# Patient Record
Sex: Female | Born: 1990 | Race: White | Hispanic: No | Marital: Single | Smoking: Never smoker
Health system: Southern US, Community
[De-identification: ages and names within clinical notes are randomized; demographics above are authoritative.]

## PROBLEM LIST (undated history)

## (undated) DIAGNOSIS — G43909 Migraine, unspecified, not intractable, without status migrainosus: Secondary | ICD-10-CM

## (undated) DIAGNOSIS — N83209 Unspecified ovarian cyst, unspecified side: Secondary | ICD-10-CM

## (undated) DIAGNOSIS — F419 Anxiety disorder, unspecified: Secondary | ICD-10-CM

## (undated) HISTORY — DX: Anxiety disorder, unspecified: F41.9

## (undated) HISTORY — DX: Migraine, unspecified, not intractable, without status migrainosus: G43.909

## (undated) HISTORY — DX: Unspecified ovarian cyst, unspecified side: N83.209

---

## 2012-08-04 ENCOUNTER — Ambulatory Visit: Payer: Self-pay | Admitting: Family Medicine

## 2016-12-09 ENCOUNTER — Emergency Department
Admission: EM | Admit: 2016-12-09 | Discharge: 2016-12-10 | Disposition: A | Discharge status: Home / Self Care | Payer: Self-pay | Attending: Emergency Medicine | Admitting: Emergency Medicine

## 2016-12-09 ENCOUNTER — Encounter: Payer: Self-pay | Admitting: Emergency Medicine

## 2016-12-09 DIAGNOSIS — A0472 Enterocolitis due to Clostridium difficile, not specified as recurrent: Secondary | ICD-10-CM | POA: Insufficient documentation

## 2016-12-09 LAB — URINALYSIS, COMPLETE (UACMP) WITH MICROSCOPIC
BACTERIA UA: NONE SEEN
Bilirubin Urine: NEGATIVE
Glucose, UA: NEGATIVE mg/dL
Ketones, ur: NEGATIVE mg/dL
Leukocytes, UA: NEGATIVE
Nitrite: NEGATIVE
PH: 7 (ref 5.0–8.0)
Protein, ur: NEGATIVE mg/dL
SPECIFIC GRAVITY, URINE: 1.014 (ref 1.005–1.030)

## 2016-12-09 LAB — COMPREHENSIVE METABOLIC PANEL
ALT: 12 U/L — ABNORMAL LOW (ref 14–54)
AST: 22 U/L (ref 15–41)
Albumin: 4.6 g/dL (ref 3.5–5.0)
Alkaline Phosphatase: 75 U/L (ref 38–126)
Anion gap: 7 (ref 5–15)
BILIRUBIN TOTAL: 0.3 mg/dL (ref 0.3–1.2)
BUN: 19 mg/dL (ref 6–20)
CHLORIDE: 108 mmol/L (ref 101–111)
CO2: 26 mmol/L (ref 22–32)
CREATININE: 1.43 mg/dL — AB (ref 0.44–1.00)
Calcium: 9.7 mg/dL (ref 8.9–10.3)
GFR, EST AFRICAN AMERICAN: 58 mL/min — AB (ref >60)
GFR, EST NON AFRICAN AMERICAN: 50 mL/min — AB (ref >60)
Glucose, Bld: 104 mg/dL — ABNORMAL HIGH (ref 65–99)
POTASSIUM: 4.1 mmol/L (ref 3.5–5.1)
Sodium: 141 mmol/L (ref 135–145)
TOTAL PROTEIN: 8 g/dL (ref 6.5–8.1)

## 2016-12-09 LAB — CBC
HEMATOCRIT: 40.9 % (ref 35.0–47.0)
Hemoglobin: 14.1 g/dL (ref 12.0–16.0)
MCH: 31.9 pg (ref 26.0–34.0)
MCHC: 34.5 g/dL (ref 32.0–36.0)
MCV: 92.4 fL (ref 80.0–100.0)
PLATELETS: 254 10*3/uL (ref 150–440)
RBC: 4.42 MIL/uL (ref 3.80–5.20)
RDW: 12.9 % (ref 11.5–14.5)
WBC: 7.4 10*3/uL (ref 3.6–11.0)

## 2016-12-09 LAB — LIPASE, BLOOD: Lipase: 39 U/L (ref 11–51)

## 2016-12-09 LAB — POCT PREGNANCY, URINE: Preg Test, Ur: NEGATIVE

## 2016-12-09 NOTE — ED Triage Notes (Addendum)
Patient ambulatory to triage with steady gait, without difficulty or distress noted; pt reports mid abd pain x 1-2 weeks accomp by nausea; recent dx with cdiff with no relief after taking meds; denies any current diarrhea, states stools are now soft

## 2016-12-10 ENCOUNTER — Encounter: Payer: Self-pay | Admitting: Radiology

## 2016-12-10 ENCOUNTER — Emergency Department: Payer: Self-pay

## 2016-12-10 MED ORDER — OXYCODONE-ACETAMINOPHEN 5-325 MG PO TABS
1.0000 | ORAL_TABLET | Freq: Once | ORAL | Status: AC
  Administered 2016-12-10: 1 via ORAL

## 2016-12-10 MED ORDER — OXYCODONE-ACETAMINOPHEN 5-325 MG PO TABS
ORAL_TABLET | ORAL | Status: AC
  Filled 2016-12-10: qty 1

## 2016-12-10 MED ORDER — ONDANSETRON HCL 4 MG/2ML IJ SOLN
INTRAMUSCULAR | Status: AC
  Administered 2016-12-10: 4 mg via INTRAVENOUS
  Filled 2016-12-10: qty 2

## 2016-12-10 MED ORDER — SODIUM CHLORIDE 0.9 % IV BOLUS (SEPSIS)
1000.0000 mL | Freq: Once | INTRAVENOUS | Status: AC
  Administered 2016-12-10: 1000 mL via INTRAVENOUS

## 2016-12-10 MED ORDER — MORPHINE SULFATE (PF) 4 MG/ML IV SOLN
4.0000 mg | Freq: Once | INTRAVENOUS | Status: AC
  Administered 2016-12-10: 4 mg via INTRAVENOUS

## 2016-12-10 MED ORDER — VANCOMYCIN HCL 125 MG PO CAPS: 125.0000 mg | ORAL_CAPSULE | Freq: Four times a day (QID) | ORAL | 0 refills | Status: AC

## 2016-12-10 MED ORDER — IOPAMIDOL (ISOVUE-300) INJECTION 61%
75.0000 mL | Freq: Once | INTRAVENOUS | Status: AC | PRN
  Administered 2016-12-10: 75 mL via INTRAVENOUS

## 2016-12-10 MED ORDER — OXYCODONE-ACETAMINOPHEN 5-325 MG PO TABS: 1.0000 | ORAL_TABLET | ORAL | 0 refills | Status: DC | PRN

## 2016-12-10 MED ORDER — ONDANSETRON HCL 4 MG/2ML IJ SOLN
4.0000 mg | Freq: Once | INTRAMUSCULAR | Status: AC
  Administered 2016-12-10: 4 mg via INTRAVENOUS

## 2016-12-10 MED ORDER — DICYCLOMINE HCL 20 MG PO TABS: 20.0000 mg | ORAL_TABLET | Freq: Three times a day (TID) | ORAL | 0 refills | Status: DC | PRN

## 2016-12-10 MED ORDER — MORPHINE SULFATE (PF) 4 MG/ML IV SOLN
INTRAVENOUS | Status: AC
  Administered 2016-12-10: 4 mg via INTRAVENOUS
  Filled 2016-12-10: qty 1

## 2016-12-10 NOTE — ED Notes (Signed)
Pt discharged to home.  Friend driving.  Discharge instructions reviewed.  Verbalized understanding.  No questions or concerns at this time.  Teach back verified.  Pt in NAD.  No items left in ED.   

## 2016-12-10 NOTE — ED Provider Notes (Signed)
[facility] Emergency Department Provider Note _   First MD Initiated Contact with Patient 12/09/16 2345     (approximate)  I have reviewed the triage vital signs and the nursing notes.   HISTORY  Chief Complaint Abdominal Pain    HPI Alyssa Macias is a 26 y.o. female with history of recently diagnosed Clostridium difficile colitis on 11/28/2016 presents to the emergency department with 8 out of 10 generalized abdominal pain. Patient denies any diarrhea stating that her stool is now formed in that she's been taking metronidazole for 6 days however she believes that "it is not working". Patient denies any fever. Patient denies any urinary symptoms.   Past medical history Clostridium difficile colitis diagnosed 11/28/2016 There are no active problems to display for this patient.   Past Surgical History:  Procedure Laterality Date  . CESAREAN SECTION      Prior to Admission medications   Medication Sig Start Date End Date Taking? Authorizing Provider  dicyclomine (BENTYL) 20 MG tablet Take 1 tablet (20 mg total) by mouth 3 (three) times daily as needed for spasms. 12/10/16 12/10/17  Darci Current, MD  oxyCODONE-acetaminophen (ROXICET) 5-325 MG tablet Take 1 tablet by mouth every 4 (four) hours as needed for severe pain. 12/10/16   Darci Current, MD  vancomycin (VANCOCIN) 125 MG capsule Take 1 capsule (125 mg total) by mouth 4 (four) times daily. 12/10/16 12/20/16  Darci Current, MD    Allergies Penicillins  No family history on file.  Social History Social History  Substance Use Topics  . Smoking status: Never Smoker  . Smokeless tobacco: Never Used  . Alcohol use No    Review of Systems Constitutional: No fever/chills Eyes: No visual changes. ENT: No sore throat. Cardiovascular: Denies chest pain. Respiratory: Denies shortness of breath. Gastrointestinal:Positive for abdominal pain no nausea vomiting  Genitourinary: Negative for  dysuria. Musculoskeletal: Negative for neck pain.  Negative for back pain. Integumentary: Negative for rash. Neurological: Negative for headaches, focal weakness or numbness.  ____________________________________________   PHYSICAL EXAM:  VITAL SIGNS: ED Triage Vitals [12/09/16 2145]  Enc Vitals Group     BP (!) 142/89     Pulse Rate (!) 115     Resp 18     Temp 98 F (36.7 C)     Temp Source Oral     SpO2 98 %     Weight 51.7 kg (114 lb)     Height 1.499 m (4\' 11" )     Head Circumference      Peak Flow      Pain Score 10     Pain Loc      Pain Edu?      Excl. in GC?     Constitutional: Alert and oriented. Well appearing and in no acute distress. Eyes: Conjunctivae are normal.  Head: Atraumatic. Mouth/Throat: Mucous membranes are moist. Neck: No stridor.   Cardiovascular: Normal rate, regular rhythm. Good peripheral circulation. Grossly normal heart sounds. Respiratory: Normal respiratory effort.  No retractions. Lungs CTAB. Gastrointestinal: Generalized tenderness to palpation. No distention.  Musculoskeletal: No lower extremity tenderness nor edema. No gross deformities of extremities. Neurologic:  Normal speech and language. No gross focal neurologic deficits are appreciated.  Skin:  Skin is warm, dry and intact. No rash noted. Psychiatric: Mood and affect are normal. Speech and behavior are normal.  ____________________________________________   LABS (all labs ordered are listed, but only abnormal results are displayed)  Labs Reviewed  COMPREHENSIVE  METABOLIC PANEL - Abnormal; Notable for the following:       Result Value   Glucose, Bld 104 (*)    Creatinine, Ser 1.43 (*)    ALT 12 (*)    GFR calc non Af Amer 50 (*)    GFR calc Af Amer 58 (*)    All other components within normal limits  URINALYSIS, COMPLETE (UACMP) WITH MICROSCOPIC - Abnormal; Notable for the following:    Color, Urine STRAW (*)    APPearance CLEAR (*)    Hgb urine dipstick SMALL (*)      Squamous Epithelial / LPF 0-5 (*)    All other components within normal limits  LIPASE, BLOOD  CBC  POC URINE PREG, ED  POCT PREGNANCY, URINE     RADIOLOGY I, [place] N BROWN, personally viewed and evaluated these images (plain radiographs) as part of my medical decision making, as well as reviewing the written report by the radiologist.  Ct Abdomen Pelvis W Contrast  Result Date: 12/10/2016 CLINICAL DATA:  Mid abdominal pain for 1-2 weeks.  Nausea. EXAM: CT ABDOMEN AND PELVIS WITH CONTRAST TECHNIQUE: Multidetector CT imaging of the abdomen and pelvis was performed using the standard protocol following bolus administration of intravenous contrast. CONTRAST:  75mL ISOVUE-300 IOPAMIDOL (ISOVUE-300) INJECTION 61% COMPARISON:  None. FINDINGS: Lower chest: No acute abnormality. Hepatobiliary: No focal liver abnormality is seen. No gallstones, gallbladder wall thickening, or biliary dilatation. Pancreas: Unremarkable. No pancreatic ductal dilatation or surrounding inflammatory changes. Spleen: Normal in size without focal abnormality. Adrenals/Urinary Tract: Adrenal glands are unremarkable. Kidneys are normal, without renal calculi, focal lesion, or hydronephrosis. Bladder is unremarkable. Stomach/Bowel: Stomach is within normal limits. Appendix is normal. No evidence of bowel wall thickening, distention, or inflammatory changes. Vascular/Lymphatic: No significant vascular findings are present. No enlarged abdominal or pelvic lymph nodes. Reproductive: Uterus and bilateral adnexa are unremarkable. Other: No focal inflammation.  No ascites. Musculoskeletal: No significant skeletal lesion. IMPRESSION: No significant abnormality. Electronically Signed   By: Ellery Plunkaniel R Mitchell M.D.   On: 12/10/2016 01:10     Procedures   ____________________________________________   INITIAL IMPRESSION / ASSESSMENT AND PLAN / ED COURSE  Pertinent labs & imaging results that were available during my care of the  patient were reviewed by me and considered in my medical decision making (see chart for details).  26 year old female with recently diagnosed C. difficile colitis presenting to the emergency department with generalized abdominal pain. Laboratory data revealed cranial 1.43 patient received 1 L IV normal saline. WBC 7.4. Patient states that her PMD prescribed vancomycin however it was over thousand dollars and a such she could not afford it. I prescribed the patient vancomycin and gave her a coupon from [facility]. However I advised the patient to complete the course of Flagyl  given the resolution of diarrhea and negative CT scan finding. Patient is advised to begin taking vancomycin if symptoms persist after completing course of Flagyl.      ____________________________________________  FINAL CLINICAL IMPRESSION(S) / ED DIAGNOSES  Final diagnoses:  C. difficile colitis     MEDICATIONS GIVEN DURING THIS VISIT:  Medications  sodium chloride 0.9 % bolus 1,000 mL (0 mLs Intravenous Stopped 12/10/16 0231)  ondansetron (ZOFRAN) injection 4 mg (4 mg Intravenous Given 12/10/16 0018)  morphine 4 MG/ML injection 4 mg (4 mg Intravenous Given 12/10/16 0018)  iopamidol (ISOVUE-300) 61 % injection 75 mL (75 mLs Intravenous Contrast Given 12/10/16 0042)  oxyCODONE-acetaminophen (PERCOCET/ROXICET) 5-325 MG per tablet 1 tablet (1 tablet  Oral Given 12/10/16 0234)     NEW OUTPATIENT MEDICATIONS STARTED DURING THIS VISIT:  Discharge Medication List as of 12/10/2016  2:14 AM    START taking these medications   Details  dicyclomine (BENTYL) 20 MG tablet Take 1 tablet (20 mg total) by mouth 3 (three) times daily as needed for spasms., Starting Wed 12/10/2016, Until Thu 12/10/2017, Print    oxyCODONE-acetaminophen (ROXICET) 5-325 MG tablet Take 1 tablet by mouth every 4 (four) hours as needed for severe pain., Starting Wed 12/10/2016, Print    vancomycin (VANCOCIN) 125 MG capsule Take 1 capsule (125 mg total) by  mouth 4 (four) times daily., Starting Wed 12/10/2016, Until Sat 12/20/2016, Print        Discharge Medication List as of 12/10/2016  2:14 AM      Discharge Medication List as of 12/10/2016  2:14 AM       Note:  This document was prepared using Dragon voice recognition software and may include unintentional dictation errors.    Darci CurrentBrown, [place] N, MD 12/10/16 [phone]

## 2017-09-28 ENCOUNTER — Ambulatory Visit (INDEPENDENT_AMBULATORY_CARE_PROVIDER_SITE_OTHER): Payer: Medicaid Other | Admitting: Certified Nurse Midwife

## 2017-09-28 ENCOUNTER — Encounter: Payer: Self-pay | Admitting: Certified Nurse Midwife

## 2017-09-28 VITALS — BP 102/68 | HR 69 | Ht 59.0 in | Wt 114.4 lb

## 2017-09-28 DIAGNOSIS — Z3042 Encounter for surveillance of injectable contraceptive: Secondary | ICD-10-CM

## 2017-09-28 DIAGNOSIS — F419 Anxiety disorder, unspecified: Secondary | ICD-10-CM | POA: Diagnosis not present

## 2017-09-28 LAB — POCT URINE PREGNANCY: PREG TEST UR: NEGATIVE

## 2017-09-28 MED ORDER — MEDROXYPROGESTERONE ACETATE 150 MG/ML IM SUSP: 150.0000 mg | INTRAMUSCULAR | 1 refills | Status: DC

## 2017-09-28 NOTE — Patient Instructions (Signed)

## 2017-09-28 NOTE — Progress Notes (Signed)
  Medication Management Clinic Visit Note  Patient: Alyssa Macias MRN: [id number] Date of Birth: 04-06-1991 PCP: [facility]   Alyssa Macias 27 y.o. female presents for a birth control visit today. She states that she was on depo prova previously and stopped because she was trying to conceive. She has since decided that she does not want to conceive and would like to start depo provera again. She states that it worked well for her. She also states that she has anxiety. She has taken medication in the past that she felt was not helpful. She believes she ha tried Zoloft and bupropion.   BP 102/68   Pulse 69   Ht  (1.499 m)   Wt 114 lb 6.4 oz (51.9 kg)   LMP 09/16/2017   BMI 23.11 kg/m   Patient Information   Past Medical History:  Diagnosis Date  . Anxiety   . Migraine   . Ovarian cyst       Past Surgical History:  Procedure Laterality Date  . CESAREAN SECTION       Family History  Problem Relation Age of Onset  . Breast cancer Maternal Grandmother   . Ovarian cancer Neg Hx   . Colon cancer Neg Hx   . Diabetes Neg Hx     New Diagnoses (since last visit): None    Social History   Substance and Sexual Activity  Alcohol Use No      Social History   Tobacco Use  Smoking Status Never Smoker  Smokeless Tobacco Never Used      Health Maintenance  Topic Date Due  . HIV Screening  08/19/2006  . TETANUS/TDAP  08/19/2010  . PAP SMEAR  08/18/2012  . INFLUENZA VACCINE  12/17/2017     Assessment and Plan:  She denies any contraindication to depo, her urine pregnancy test today was negative. Order placed with instructions to return for nurse visit for injection. Discussed anxiety and her symptoms.She states she gets sweaty, heart races, and feels panicked. Discussed need to be on medication for 4-6 wks prior to it being therapeutic. She states that she was on the bupropion for a few months and stopped because it was not helping. Suggested that  she see a specialist for psychiatric medication management. She agrees to plan.  Referral placed for behavior health. Follow up for injection.   Doreene Burke, CNM

## 2017-10-01 ENCOUNTER — Ambulatory Visit (INDEPENDENT_AMBULATORY_CARE_PROVIDER_SITE_OTHER): Payer: Medicaid Other | Admitting: Certified Nurse Midwife

## 2017-10-01 VITALS — BP 113/73 | HR 70 | Wt 117.1 lb

## 2017-10-01 DIAGNOSIS — Z3042 Encounter for surveillance of injectable contraceptive: Secondary | ICD-10-CM

## 2017-10-01 MED ORDER — MEDROXYPROGESTERONE ACETATE 150 MG/ML IM SUSP
150.0000 mg | Freq: Once | INTRAMUSCULAR | Status: AC
  Administered 2017-10-01: 150 mg via INTRAMUSCULAR

## 2017-10-01 NOTE — Progress Notes (Signed)
Date last pap: .na Last Depo-Provera: .na Side Effects if any: .na Serum HCG indicated? .na Depo-Provera 150 mg IM given by: .Rosine Beat, CMA Next appointment due .Aug 1-Aug 15 BP 113/73   Pulse 70   Wt 117 lb 1.6 oz (53.1 kg)   LMP 09/16/2017   BMI 23.65 kg/m

## 2017-10-02 ENCOUNTER — Encounter: Payer: Self-pay | Admitting: Certified Nurse Midwife

## 2017-12-21 ENCOUNTER — Ambulatory Visit: Payer: Medicaid Other

## 2017-12-28 ENCOUNTER — Ambulatory Visit: Payer: Medicaid Other

## 2017-12-30 ENCOUNTER — Ambulatory Visit: Payer: Medicaid Other | Admitting: Certified Nurse Midwife

## 2017-12-31 ENCOUNTER — Ambulatory Visit: Payer: Medicaid Other | Admitting: Obstetrics and Gynecology

## 2018-01-04 ENCOUNTER — Ambulatory Visit (INDEPENDENT_AMBULATORY_CARE_PROVIDER_SITE_OTHER): Payer: Medicaid Other | Admitting: Certified Nurse Midwife

## 2018-01-04 VITALS — BP 125/82 | HR 98 | Ht 59.0 in | Wt 110.0 lb

## 2018-01-04 DIAGNOSIS — Z3042 Encounter for surveillance of injectable contraceptive: Secondary | ICD-10-CM | POA: Diagnosis not present

## 2018-01-04 MED ORDER — MEDROXYPROGESTERONE ACETATE 150 MG/ML IM SUSP
150.0000 mg | Freq: Once | INTRAMUSCULAR | Status: AC
Start: 2018-01-04 — End: 2018-01-04
  Administered 2018-01-04: 150 mg via INTRAMUSCULAR

## 2018-01-04 NOTE — Progress Notes (Signed)
Pt presents today for depo injection. Has no concerns or side effects.   Date last pap: N/A. Last Depo-Provera: 10/05/17. Side Effects if any: N/A. Serum HCG indicated? N/A. Depo-Provera 150 mg IM given by: AMS. Next appointment due 03/22/18---04/05/18  BP 125/82   Pulse 98   Ht 4\' 11"  (1.499 m)   Wt 110 lb (49.9 kg)   BMI 22.22 kg/m

## 2018-03-19 ENCOUNTER — Telehealth: Payer: Self-pay | Admitting: Certified Nurse Midwife

## 2018-03-19 ENCOUNTER — Other Ambulatory Visit: Payer: Self-pay

## 2018-03-19 MED ORDER — MEDROXYPROGESTERONE ACETATE 150 MG/ML IM SUSP: 150.0000 mg | INTRAMUSCULAR | 2 refills | Status: AC

## 2018-03-19 NOTE — Telephone Encounter (Signed)
The patient called and stated that she would like to speak with a nurse in regards to her needing a depo injection sent to her pharmacy due to her not having any refills. The patient also needs to know if an appointment/Annual is necessary before getting a refill of the prescription. Please advise.

## 2018-03-22 ENCOUNTER — Ambulatory Visit: Payer: Medicaid Other

## 2018-03-31 NOTE — Progress Notes (Signed)
Date last pap: NA Last Depo-Provera: 01/04/18 Side Effects if any:  Serum HCG indicated? NA Depo-Provera 150 mg IM given by: AS, CMA Next appointment due 06/18/18-07/02/18 BP 115/80   Pulse 99   Ht 4\' 11"  (1.499 m)   Wt 112 lb (50.8 kg)   BMI 22.62 kg/m

## 2018-04-02 ENCOUNTER — Ambulatory Visit (INDEPENDENT_AMBULATORY_CARE_PROVIDER_SITE_OTHER): Payer: Medicaid Other | Admitting: Certified Nurse Midwife

## 2018-04-02 VITALS — BP 115/80 | HR 99 | Ht 59.0 in | Wt 112.0 lb

## 2018-04-02 DIAGNOSIS — Z3042 Encounter for surveillance of injectable contraceptive: Secondary | ICD-10-CM

## 2018-04-02 MED ORDER — MEDROXYPROGESTERONE ACETATE 150 MG/ML IM SUSP
150.0000 mg | Freq: Once | INTRAMUSCULAR | Status: AC
  Administered 2018-04-02: 150 mg via INTRAMUSCULAR

## 2018-05-18 ENCOUNTER — Emergency Department
Admission: EM | Admit: 2018-05-18 | Discharge: 2018-05-18 | Disposition: A | Discharge status: Home / Self Care | Payer: Medicaid Other | Attending: Student in an Organized Health Care Education/Training Program | Admitting: Student in an Organized Health Care Education/Training Program

## 2018-05-18 ENCOUNTER — Other Ambulatory Visit: Payer: Self-pay

## 2018-05-18 ENCOUNTER — Encounter: Payer: Self-pay | Admitting: Emergency Medicine

## 2018-05-18 DIAGNOSIS — R51 Headache: Secondary | ICD-10-CM | POA: Diagnosis present

## 2018-05-18 DIAGNOSIS — G43709 Chronic migraine without aura, not intractable, without status migrainosus: Secondary | ICD-10-CM | POA: Diagnosis not present

## 2018-05-18 MED ORDER — METOCLOPRAMIDE HCL 5 MG/ML IJ SOLN
10.0000 mg | Freq: Once | INTRAMUSCULAR | Status: AC
  Administered 2018-05-18: 10 mg via INTRAVENOUS
  Filled 2018-05-18: qty 2

## 2018-05-18 MED ORDER — METOCLOPRAMIDE HCL 5 MG PO TABS: 5.0000 mg | ORAL_TABLET | Freq: Three times a day (TID) | ORAL | 0 refills | Status: DC | PRN

## 2018-05-18 MED ORDER — ORPHENADRINE CITRATE 30 MG/ML IJ SOLN
60.0000 mg | Freq: Once | INTRAMUSCULAR | Status: AC
  Administered 2018-05-18: 60 mg via INTRAVENOUS
  Filled 2018-05-18: qty 2

## 2018-05-18 MED ORDER — DIPHENHYDRAMINE HCL 50 MG/ML IJ SOLN
25.0000 mg | Freq: Once | INTRAMUSCULAR | Status: AC
  Administered 2018-05-18: 25 mg via INTRAVENOUS
  Filled 2018-05-18: qty 1

## 2018-05-18 MED ORDER — KETOROLAC TROMETHAMINE 30 MG/ML IJ SOLN
30.0000 mg | Freq: Once | INTRAMUSCULAR | Status: AC
  Administered 2018-05-18: 30 mg via INTRAVENOUS
  Filled 2018-05-18: qty 1

## 2018-05-18 MED ORDER — KETOROLAC TROMETHAMINE 10 MG PO TABS: 10.0000 mg | ORAL_TABLET | Freq: Three times a day (TID) | ORAL | 0 refills | Status: DC

## 2018-05-18 NOTE — ED Triage Notes (Signed)
Patient reports left-sided stabbing headache as well as sensitivity to sound and light x3 days. History of migraine. No relief of headache with home medications.

## 2018-05-18 NOTE — ED Provider Notes (Signed)
[facility] Emergency Department Provider Note ____________________________________________  Time seen: 1044  I have reviewed the triage vital signs and the nursing notes.  HISTORY  Chief Complaint  Migraine  HPI Alyssa Macias is a 27 y.o. female who presents to the ED for evaluation of a left-sided headache.  Patient with a history of migraines, reports to having left-sided headache pain as well as sensitivity to light and sound.  She describes onset about 3 days prior.  She denies any benefit with medications used over-the-counter.  She denies any vomiting, vision loss, tinnitus, vertigo, or syncope.  She denies any fevers, chills, neck pain, or chest pain.  Past Medical History:  Diagnosis Date  . Anxiety   . Migraine   . Ovarian cyst     There are no active problems to display for this patient.   Past Surgical History:  Procedure Laterality Date  . CESAREAN SECTION      Prior to Admission medications   Medication Sig Start Date End Date Taking? Authorizing Provider  amitriptyline (ELAVIL) 50 MG tablet Take 50 mg by mouth at bedtime.    [provider]  ketorolac (TORADOL) 10 MG tablet Take 1 tablet (10 mg total) by mouth every 8 (eight) hours. 05/18/18   Tyleek Smick, Charlesetta IvoryJenise V Bacon, PA-C  medroxyPROGESTERone (DEPO-PROVERA) 150 MG/ML injection Inject 1 mL (150 mg total) into the muscle every 3 (three) months. 03/19/18   Doreene Burkehompson, Annie, CNM  metoCLOPramide (REGLAN) 5 MG tablet Take 1 tablet (5 mg total) by mouth every 8 (eight) hours as needed for nausea or vomiting. 05/18/18   Fredericka Bottcher, Charlesetta IvoryJenise V Bacon, PA-C    Allergies Penicillins  Family History  Problem Relation Age of Onset  . Breast cancer Maternal Grandmother   . Ovarian cancer Neg Hx   . Colon cancer Neg Hx   . Diabetes Neg Hx     Social History Social History   Tobacco Use  . Smoking status: Never Smoker  . Smokeless tobacco: Never Used  Substance Use Topics  . Alcohol use:  No  . Drug use: No    Review of Systems  Constitutional: Negative for fever. Eyes: Negative for visual changes. Reports photosensitivity.  ENT: Negative for sore throat. Cardiovascular: Negative for chest pain. Respiratory: Negative for shortness of breath. Gastrointestinal: Negative for abdominal pain, vomiting and diarrhea. Genitourinary: Negative for dysuria. Musculoskeletal: Negative for back pain. Skin: Negative for rash. Neurological: Reports headache as above.  Negative for focal weakness or numbness. ____________________________________________  PHYSICAL EXAM:  VITAL SIGNS: ED Triage Vitals [05/18/18 0928]  Enc Vitals Group     BP 117/68     Pulse Rate 67     Resp 16     Temp 98 F (36.7 C)     Temp Source Oral     SpO2 100 %     Weight 117 lb (53.1 kg)     Height 4\' 11"  (1.499 m)     Head Circumference      Peak Flow      Pain Score 9     Pain Loc      Pain Edu?      Excl. in GC?     Constitutional: Alert and oriented. Well appearing and in no distress. Head: Normocephalic and atraumatic. Eyes: Conjunctivae are normal. PERRL. Normal extraocular movements and fundi.  Ears: Canals clear. TMs intact bilaterally. Nose: No congestion/rhinorrhea/epistaxis. Mouth/Throat: Mucous membranes are moist. Neck: Supple. No thyromegaly. Hematological/Lymphatic/Immunological: No cervical lymphadenopathy. Cardiovascular: Normal rate, regular  rhythm. Normal distal pulses. Respiratory: Normal respiratory effort. No wheezes/rales/rhonchi. Gastrointestinal: Soft and nontender. No distention. Musculoskeletal: Nontender with normal range of motion in all extremities.  Neurologic: CN II-XII grossly intact. Normal gait without ataxia. Normal speech and language. No gross focal neurologic deficits are appreciated. Skin:  Skin is warm, dry and intact. No rash noted. Psychiatric: Mood and affect are normal. Patient exhibits appropriate insight and  judgment. ____________________________________________  PROCEDURES  Procedures Reglan 10 mg IVP Benadryl 25 mg IVP Toradol 30 mg IVP Norflex 60 mg IVP ____________________________________________  INITIAL IMPRESSION / ASSESSMENT AND PLAN / ED COURSE  Patient with ED evaluation and management of a 3-day complaint of migraine headache, does not respond to her typical over-the-counter medicine cocktail.  She reports pain to the top of the head as well as photosensitivity and photosensitivity.  Patient is given IV medication in the ED and reports complete resolution of her headache pain.  She is requesting discharge home at this time.  She be discharged with a prescription for Reglan, ketorolac, and give instruction to take over-the-counter Benadryl as well as Tylenol when she has a headache.  She will follow-up with primary provider or 1 of the local community clinics. ____________________________________________  FINAL CLINICAL IMPRESSION(S) / ED DIAGNOSES  Final diagnoses:  Chronic migraine without aura without status migrainosus, not intractable      Callin Ashe, Charlesetta IvoryJenise V Bacon, PA-C 05/18/18 1238    Willy Eddyobinson, Patrick, MD 05/18/18 1241

## 2018-05-18 NOTE — ED Notes (Signed)
See triage note  Presents with headache for the past 3 days   Describes pain to left side of head  Describes pain as stabbing  Positive nausea

## 2018-05-18 NOTE — Discharge Instructions (Signed)
You have been treated for a migraine. Take the prescription meds as directed. You may also take OTC Benadryl along with Tylenol for headache pain relief.  Follow-up with your provider as needed.

## 2018-06-17 NOTE — Progress Notes (Deleted)
Date last pap: n/a. Last Depo-Provera: 04/02/18. Side Effects if any: . Serum HCG indicated? n/a. Depo-Provera 150 mg IM given by: FH, LPN. Next appointment due April 18-Sep 18, 2018

## 2018-06-18 ENCOUNTER — Ambulatory Visit: Payer: Medicaid Other

## 2018-06-23 ENCOUNTER — Ambulatory Visit: Payer: Medicaid Other

## 2018-06-30 ENCOUNTER — Ambulatory Visit (INDEPENDENT_AMBULATORY_CARE_PROVIDER_SITE_OTHER): Payer: Medicaid Other | Admitting: Certified Nurse Midwife

## 2018-06-30 VITALS — BP 119/84 | HR 83 | Ht 59.0 in | Wt 115.9 lb

## 2018-06-30 DIAGNOSIS — Z3042 Encounter for surveillance of injectable contraceptive: Secondary | ICD-10-CM

## 2018-06-30 MED ORDER — MEDROXYPROGESTERONE ACETATE 150 MG/ML IM SUSP
150.0000 mg | Freq: Once | INTRAMUSCULAR | Status: AC
  Administered 2018-06-30: 150 mg via INTRAMUSCULAR

## 2018-06-30 NOTE — Progress Notes (Signed)
Date last pap: 2018 per patient (Neg). Last Depo-Provera: 04/02/2018. Side Effects if any: denies any side effects. Depo-Provera 150 mg IM given by: Park Meo, CMA. Next Depo-Provera due between 09/16/18-09/30/18.  Advised patient to schedule yearly exam.  Patient verbalized understanding.   BP 119/84   Pulse 83   Ht 4\' 11"  (1.499 m)   Wt 115 lb 14.4 oz (52.6 kg)   LMP 06/14/2018 (Approximate)   BMI 23.41 kg/m

## 2018-08-17 IMAGING — CT CT ABD-PELV W/ CM
2 of 4 series · 16 of 46 positions shown, 18 images · IV contrast (APPLIED)
Comparison: None.

CLINICAL DATA: Mid abdominal pain for 1-2 weeks.  Nausea.

EXAM:
CT ABDOMEN AND PELVIS WITH CONTRAST
TECHNIQUE: Multidetector CT imaging of the abdomen and pelvis was performed
using the standard protocol following bolus administration of
intravenous contrast.
CONTRAST:  75mL B7LOQV-FZZ IOPAMIDOL (B7LOQV-FZZ) INJECTION 61%

[Series 2: routine abd/pel with · axial · 0.61mm/px · z∈[-912,-547]mm · 13 of 81 slices shown, 15 images]
[im 4/81  soft-tissue]
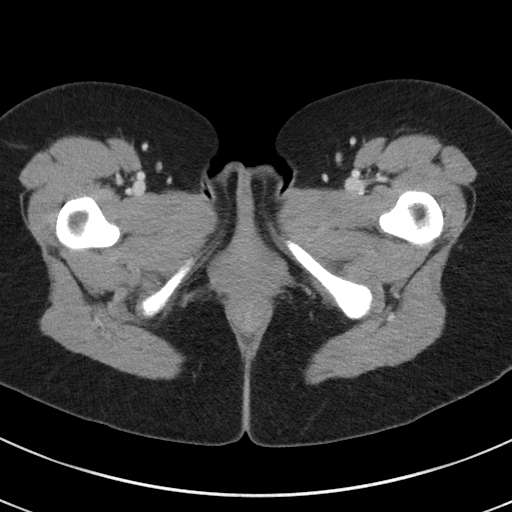
[im 4/81  bone]
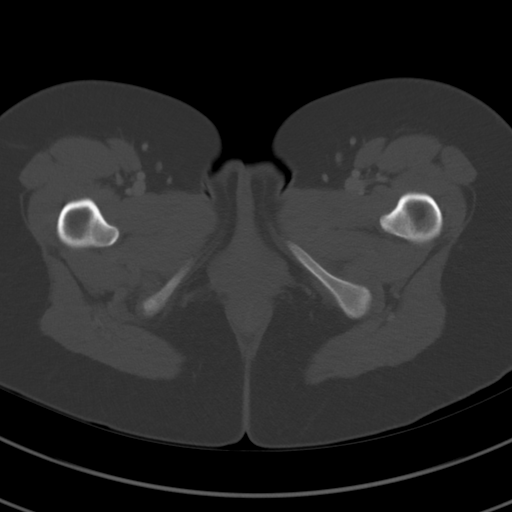
[im 10/81  soft-tissue]
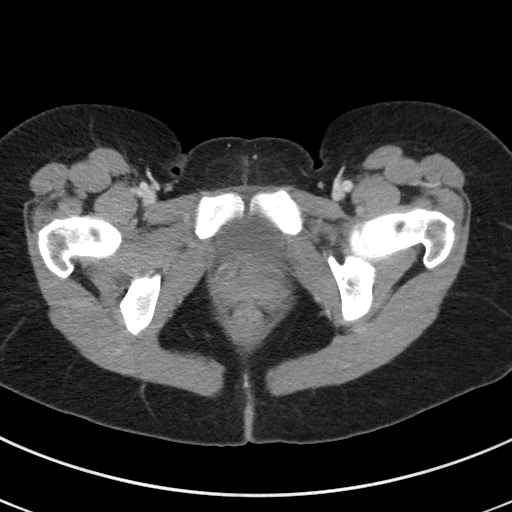
[im 16/81  soft-tissue]
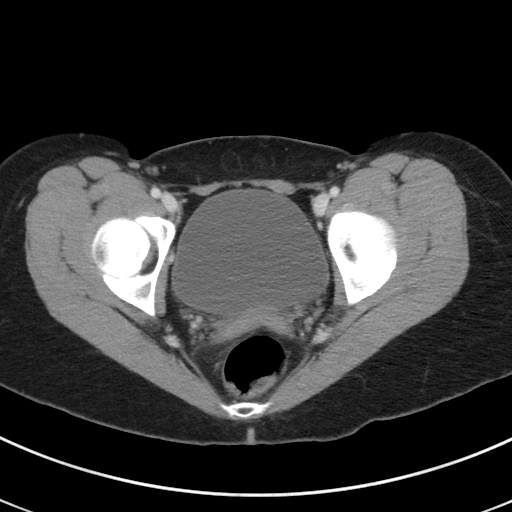
[im 22/81  soft-tissue]
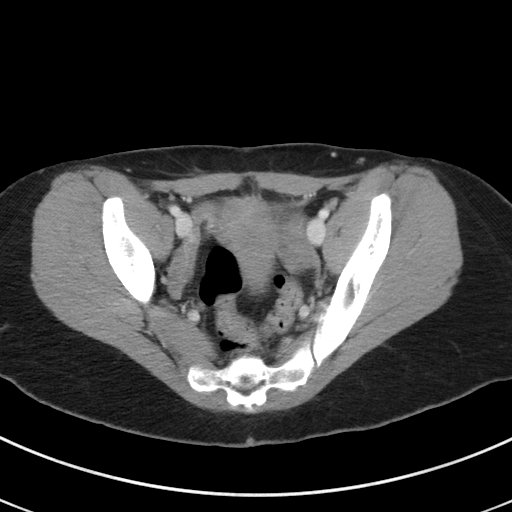
[im 28/81  soft-tissue]
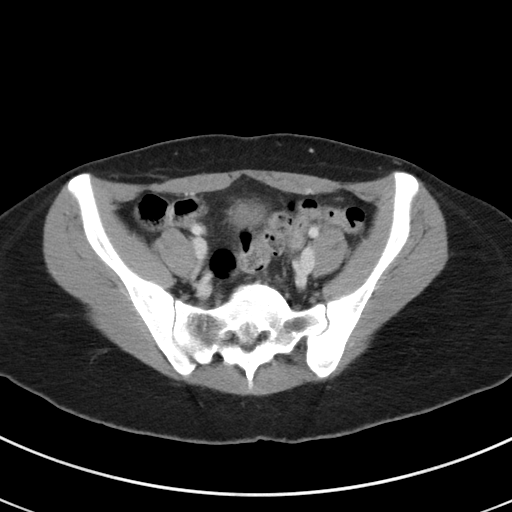
[im 34/81  soft-tissue]
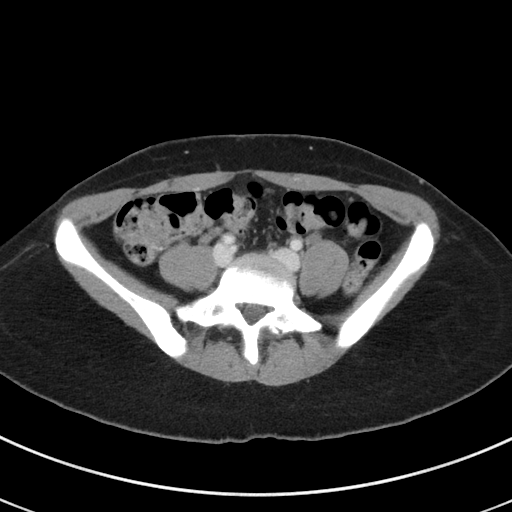
[im 41/81  soft-tissue]
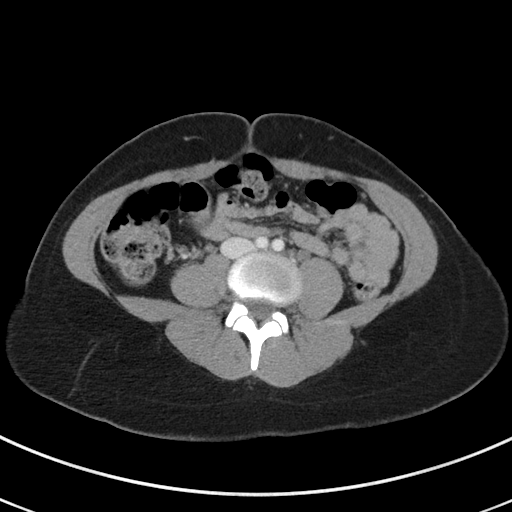
[im 47/81  soft-tissue]
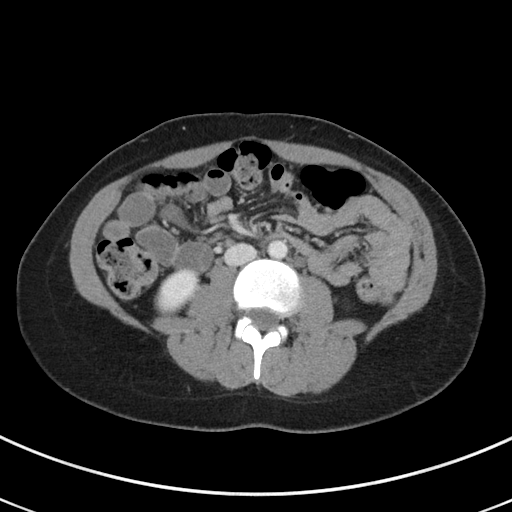
[im 53/81  soft-tissue]
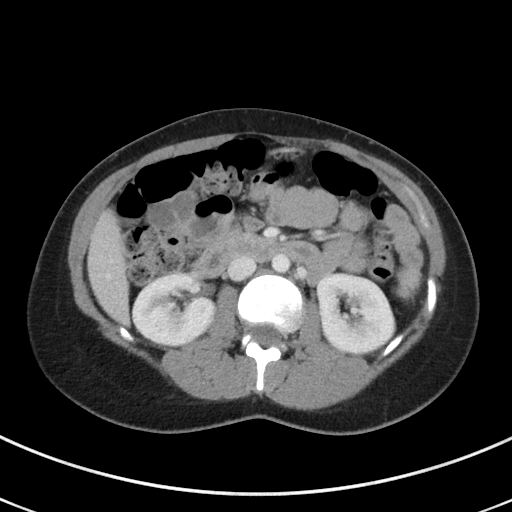
[im 53/81  bone]
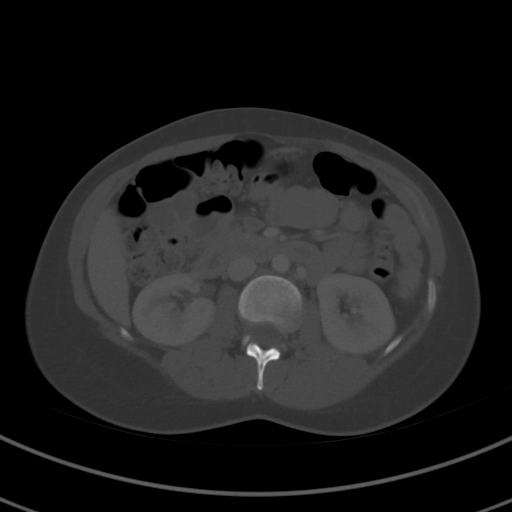
[im 59/81  soft-tissue]
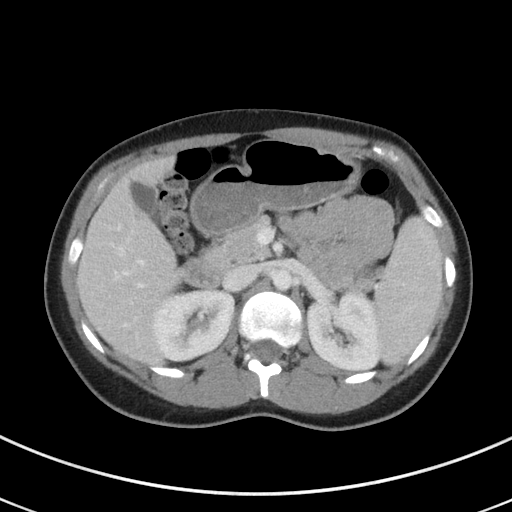
[im 65/81  soft-tissue]
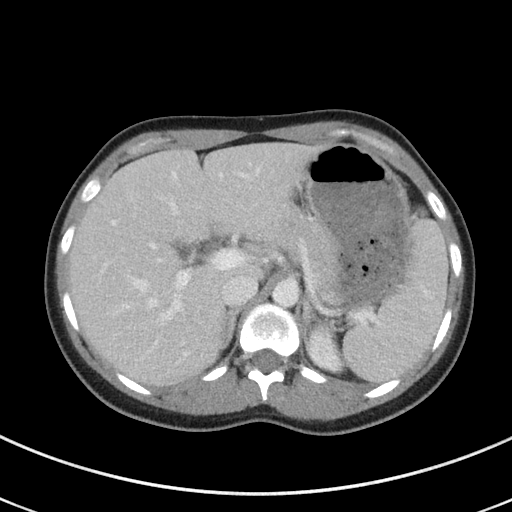
[im 71/81  soft-tissue]
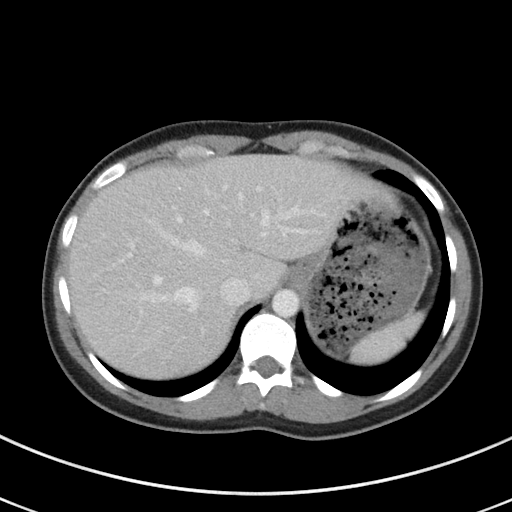
[im 77/81  soft-tissue]
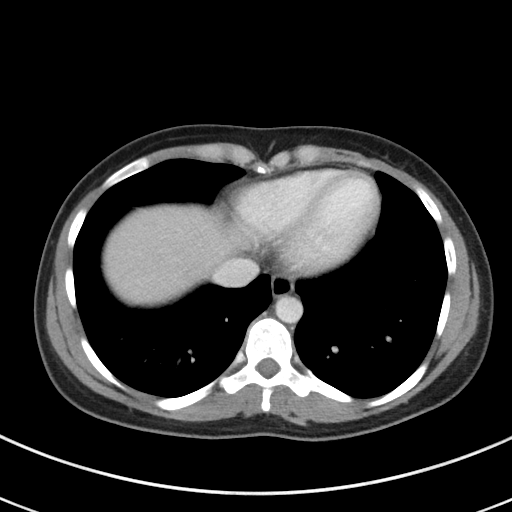

[Series 5: coronal st · coronal · 0.60mm/px · 3 of 65 slices shown]
[im 22/65  soft-tissue]
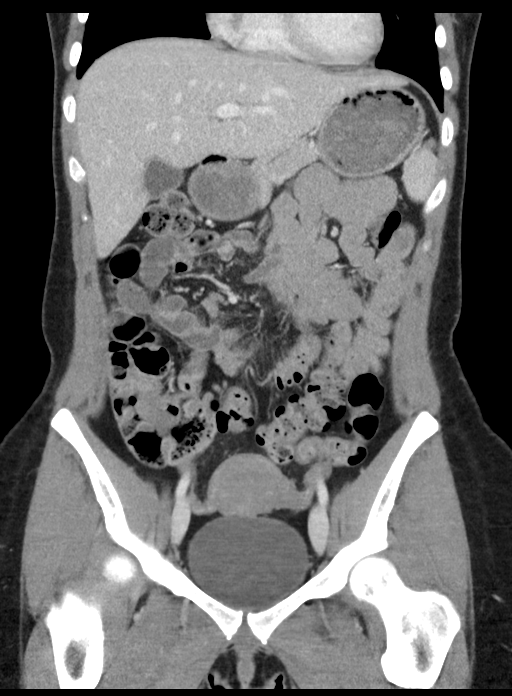
[im 29/65  soft-tissue]
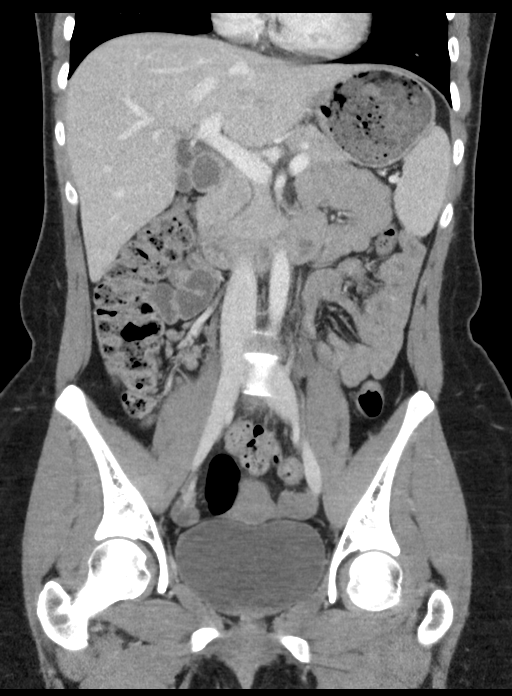
[im 36/65  soft-tissue]
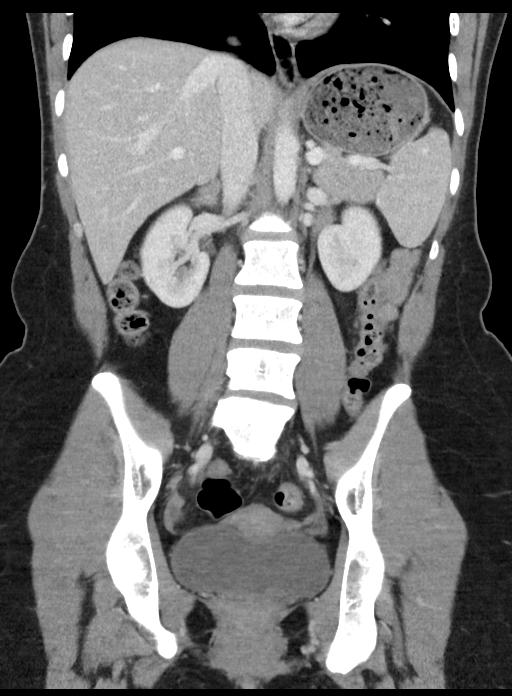

[16 of 46 positions shown; findings below may reference images not displayed]

FINDINGS: Lower chest: No acute abnormality.

Hepatobiliary: No focal liver abnormality is seen. No gallstones,
gallbladder wall thickening, or biliary dilatation.

Pancreas: Unremarkable. No pancreatic ductal dilatation or
surrounding inflammatory changes.

Spleen: Normal in size without focal abnormality.

Adrenals/Urinary Tract: Adrenal glands are unremarkable. Kidneys are
normal, without renal calculi, focal lesion, or hydronephrosis.
Bladder is unremarkable.

Stomach/Bowel: Stomach is within normal limits. Appendix is normal.
No evidence of bowel wall thickening, distention, or inflammatory
changes.

Vascular/Lymphatic: No significant vascular findings are present. No
enlarged abdominal or pelvic lymph nodes.

Reproductive: Uterus and bilateral adnexa are unremarkable.

Other: No focal inflammation.  No ascites.

Musculoskeletal: No significant skeletal lesion.
IMPRESSION: No significant abnormality.

## 2018-09-17 ENCOUNTER — Other Ambulatory Visit: Payer: Self-pay

## 2018-09-17 ENCOUNTER — Ambulatory Visit (INDEPENDENT_AMBULATORY_CARE_PROVIDER_SITE_OTHER): Payer: Medicaid Other | Admitting: Certified Nurse Midwife

## 2018-09-17 ENCOUNTER — Telehealth: Payer: Self-pay

## 2018-09-17 VITALS — BP 116/77 | HR 70 | Ht 59.0 in | Wt 119.6 lb

## 2018-09-17 DIAGNOSIS — Z3042 Encounter for surveillance of injectable contraceptive: Secondary | ICD-10-CM | POA: Diagnosis not present

## 2018-09-17 MED ORDER — MEDROXYPROGESTERONE ACETATE 150 MG/ML IM SUSP
150.0000 mg | Freq: Once | INTRAMUSCULAR | Status: AC
Start: 2018-09-17 — End: 2018-09-17
  Administered 2018-09-17: 150 mg via INTRAMUSCULAR

## 2018-09-17 NOTE — Progress Notes (Signed)
Date last pap: na Last Depo-Provera: 06/30/18 Side Effects if any: na Serum HCG indicated?na Depo-Provera 150 mg IM given by: Rosine Beat, CMA Next appointment due July 17-December 17, 2018 BP 116/77   Pulse 70   Ht 4\' 11"  (1.499 m)   Wt 119 lb 9.6 oz (54.3 kg)   BMI 24.16 kg/m

## 2018-09-17 NOTE — Telephone Encounter (Signed)
Pt called back and stated that a coworker at her job had been tested positive for COVID-19 so everyone at her job was tested on last Friday. Pt stated that her results came back negative. Pt is not having any symptoms or COVID-19 at this time and is aware of the no visitors policy.    Coronavirus (COVID-19) Are you at risk?  Are you at risk for the Coronavirus (COVID-19)?  To be considered HIGH RISK for Coronavirus (COVID-19), you have to meet the following criteria:  . Traveled to [place], [place], [place], [place] or [place]; or in the [place] to [place], [place], or [place]; and have fever, cough, and shortness of breath within the last 2 weeks of travel OR . Been in close contact with a person diagnosed with COVID-19 within the last 2 weeks and have fever, cough, and shortness of breath . IF YOU DO NOT MEET THESE CRITERIA, YOU ARE CONSIDERED LOW RISK FOR COVID-19.  What to do if you are HIGH RISK for COVID-19?  Marland Kitchen. If you are having a medical emergency, call 911. . Seek medical care right away. Before you go to a doctor's office, urgent care or emergency department, call ahead and tell them about your recent travel, contact with someone diagnosed with COVID-19, and your symptoms. You should receive instructions from your physician's office regarding next steps of care.  . When you arrive at healthcare provider, tell the healthcare staff immediately you have returned from visiting [place], [place], [place], [place] or [place]; or traveled in the [place] to [place], [place], [place], or [place]; in the last two weeks or you have been in close contact with a person diagnosed with COVID-19 in the last 2 weeks.   . Tell the health care staff about your symptoms: fever, cough and shortness of breath. . After you have been seen by a medical provider, you will be either: o Tested for (COVID-19) and discharged home on quarantine except to seek medical care if symptoms  worsen, and asked to  - Stay home and avoid contact with others until you get your results (4-5 days)  - Avoid travel on public transportation if possible (such as bus, train, or airplane) or o Sent to the Emergency Department by EMS for evaluation, COVID-19 testing, and possible admission depending on your condition and test results.  What to do if you are LOW RISK for COVID-19?  Reduce your risk of any infection by using the same precautions used for avoiding the common cold or flu:  Marland Kitchen. Wash your hands often with soap and warm water for at least 20 seconds.  If soap and water are not readily available, use an alcohol-based hand sanitizer with at least 60% alcohol.  . If coughing or sneezing, cover your mouth and nose by coughing or sneezing into the elbow areas of your shirt or coat, into a tissue or into your sleeve (not your hands). . Avoid shaking hands with others and consider head nods or verbal greetings only. . Avoid touching your eyes, nose, or mouth with unwashed hands.  . Avoid close contact with people who are sick. . Avoid places or events with large numbers of people in one location, like concerts or sporting events. . Carefully consider travel plans you have or are making. . If you are planning any travel outside or inside the [place], visit the CDC's Travelers' Health webpage for the latest health notices. . If you have some symptoms  but not all symptoms, continue to monitor at home and seek medical attention if your symptoms worsen. . If you are having a medical emergency, call 911.   ADDITIONAL HEALTHCARE OPTIONS FOR PATIENTS  [facility] / e-Visit: [email or web address]         [facility]: [phone]  [facility]: [phone]                   [facility]: [phone]

## 2018-09-29 ENCOUNTER — Encounter: Payer: Medicaid Other | Admitting: Certified Nurse Midwife

## 2018-10-01 ENCOUNTER — Encounter: Payer: Medicaid Other | Admitting: Certified Nurse Midwife

## 2018-10-01 ENCOUNTER — Encounter: Payer: Self-pay | Admitting: *Deleted

## 2018-10-01 ENCOUNTER — Telehealth: Payer: Self-pay | Admitting: Certified Nurse Midwife

## 2018-10-01 NOTE — Telephone Encounter (Signed)
The patient called wanting to know when she needs to come in for her next depo injection. Please advise.

## 2018-10-01 NOTE — Telephone Encounter (Signed)
Sent pt mcm

## 2018-10-04 ENCOUNTER — Ambulatory Visit: Payer: Medicaid Other

## 2018-11-12 ENCOUNTER — Encounter: Payer: Medicaid Other | Admitting: Certified Nurse Midwife

## 2018-11-16 ENCOUNTER — Telehealth: Payer: Self-pay

## 2018-11-16 NOTE — Telephone Encounter (Signed)
Coronavirus (COVID-19) Are you at risk?  Are you at risk for the Coronavirus (COVID-19)?  To be considered HIGH RISK for Coronavirus (COVID-19), you have to meet the following criteria:  . Traveled to [place], [place], [place], [place] or [place]; or in the [place] to [place], [place], [place], or [place]; and have fever, cough, and shortness of breath within the last 2 weeks of travel OR . Been in close contact with a person diagnosed with COVID-19 within the last 2 weeks and have fever, cough, and shortness of breath . IF YOU DO NOT MEET THESE CRITERIA, YOU ARE CONSIDERED LOW RISK FOR COVID-19.  What to do if you are HIGH RISK for COVID-19?  . If you are having a medical emergency, call 911. . Seek medical care right away. Before you go to a doctor's office, urgent care or emergency department, call ahead and tell them about your recent travel, contact with someone diagnosed with COVID-19, and your symptoms. You should receive instructions from your physician's office regarding next steps of care.  . When you arrive at healthcare provider, tell the healthcare staff immediately you have returned from visiting [place], [place], [place], [place] or [place]; or traveled in the [place] to [place], [place], [place], or [place]; in the last two weeks or you have been in close contact with a person diagnosed with COVID-19 in the last 2 weeks.   . Tell the health care staff about your symptoms: fever, cough and shortness of breath. . After you have been seen by a medical provider, you will be either: o Tested for (COVID-19) and discharged home on quarantine except to seek medical care if symptoms worsen, and asked to  - Stay home and avoid contact with others until you get your results (4-5 days)  - Avoid travel on public transportation if possible (such as bus, train, or airplane) or o Sent to the Emergency Department by EMS for evaluation, COVID-19 testing, and possible  admission depending on your condition and test results.  What to do if you are LOW RISK for COVID-19?  Reduce your risk of any infection by using the same precautions used for avoiding the common cold or flu:  . Wash your hands often with soap and warm water for at least 20 seconds.  If soap and water are not readily available, use an alcohol-based hand sanitizer with at least 60% alcohol.  . If coughing or sneezing, cover your mouth and nose by coughing or sneezing into the elbow areas of your shirt or coat, into a tissue or into your sleeve (not your hands). . Avoid shaking hands with others and consider head nods or verbal greetings only. . Avoid touching your eyes, nose, or mouth with unwashed hands.  . Avoid close contact with people who are Vickii Volland. . Avoid places or events with large numbers of people in one location, like concerts or sporting events. . Carefully consider travel plans you have or are making. . If you are planning any travel outside or inside the [place], visit the CDC's Travelers' Health webpage for the latest health notices. . If you have some symptoms but not all symptoms, continue to monitor at home and seek medical attention if your symptoms worsen. . If you are having a medical emergency, call 911.  11/16/18 SCREENING NEG SLS ADDITIONAL HEALTHCARE OPTIONS FOR PATIENTS  [facility] / e-Visit: [email or web address]         [facility]: [phone]    [facility]: [phone]                   [facility]: [phone]  

## 2018-11-17 ENCOUNTER — Encounter: Payer: Medicaid Other | Admitting: Certified Nurse Midwife

## 2018-12-17 ENCOUNTER — Encounter: Payer: Medicaid Other | Admitting: Certified Nurse Midwife

## 2019-02-15 ENCOUNTER — Encounter: Payer: Medicaid Other | Admitting: Certified Nurse Midwife
